# Patient Record
Sex: Male | Born: 1975 | Race: Black or African American | Hispanic: No | Marital: Married
Health system: Southern US, Community
[De-identification: ages and names within clinical notes are randomized; demographics above are authoritative.]

---

## 1998-06-28 ENCOUNTER — Emergency Department (HOSPITAL_COMMUNITY): Admission: EM | Admit: 1998-06-28 | Discharge: 1998-06-28 | Payer: Self-pay | Admitting: Emergency Medicine

## 1998-06-28 ENCOUNTER — Encounter: Payer: Self-pay | Admitting: Emergency Medicine

## 2005-06-09 ENCOUNTER — Emergency Department (HOSPITAL_COMMUNITY): Admission: EM | Admit: 2005-06-09 | Discharge: 2005-06-09 | Payer: Self-pay | Admitting: Emergency Medicine

## 2005-10-26 ENCOUNTER — Ambulatory Visit: Payer: Self-pay | Admitting: Internal Medicine

## 2005-10-26 ENCOUNTER — Inpatient Hospital Stay (HOSPITAL_COMMUNITY): Admission: EM | Admit: 2005-10-26 | Discharge: 2005-10-30 | Payer: Self-pay | Admitting: Emergency Medicine

## 2005-11-01 ENCOUNTER — Ambulatory Visit: Payer: Self-pay | Admitting: Internal Medicine

## 2005-11-05 ENCOUNTER — Ambulatory Visit: Payer: Self-pay | Admitting: Internal Medicine

## 2005-11-13 ENCOUNTER — Ambulatory Visit: Payer: Self-pay | Admitting: Internal Medicine

## 2005-11-19 ENCOUNTER — Ambulatory Visit: Payer: Self-pay | Admitting: Internal Medicine

## 2005-12-03 ENCOUNTER — Ambulatory Visit: Payer: Self-pay | Admitting: Internal Medicine

## 2005-12-17 ENCOUNTER — Ambulatory Visit: Payer: Self-pay | Admitting: Internal Medicine

## 2006-01-07 ENCOUNTER — Ambulatory Visit: Payer: Self-pay | Admitting: Hospitalist

## 2006-01-14 ENCOUNTER — Ambulatory Visit: Payer: Self-pay | Admitting: Hospitalist

## 2006-06-06 IMAGING — CR DG CHEST 2V
2 series · 2 of 2 positions shown · non-contrast
Comparison: none

CLINICAL DATA: Chest pain and shortness of breath.  
 CHEST - 2 VIEWS:

[view not recorded (1 of 2)]
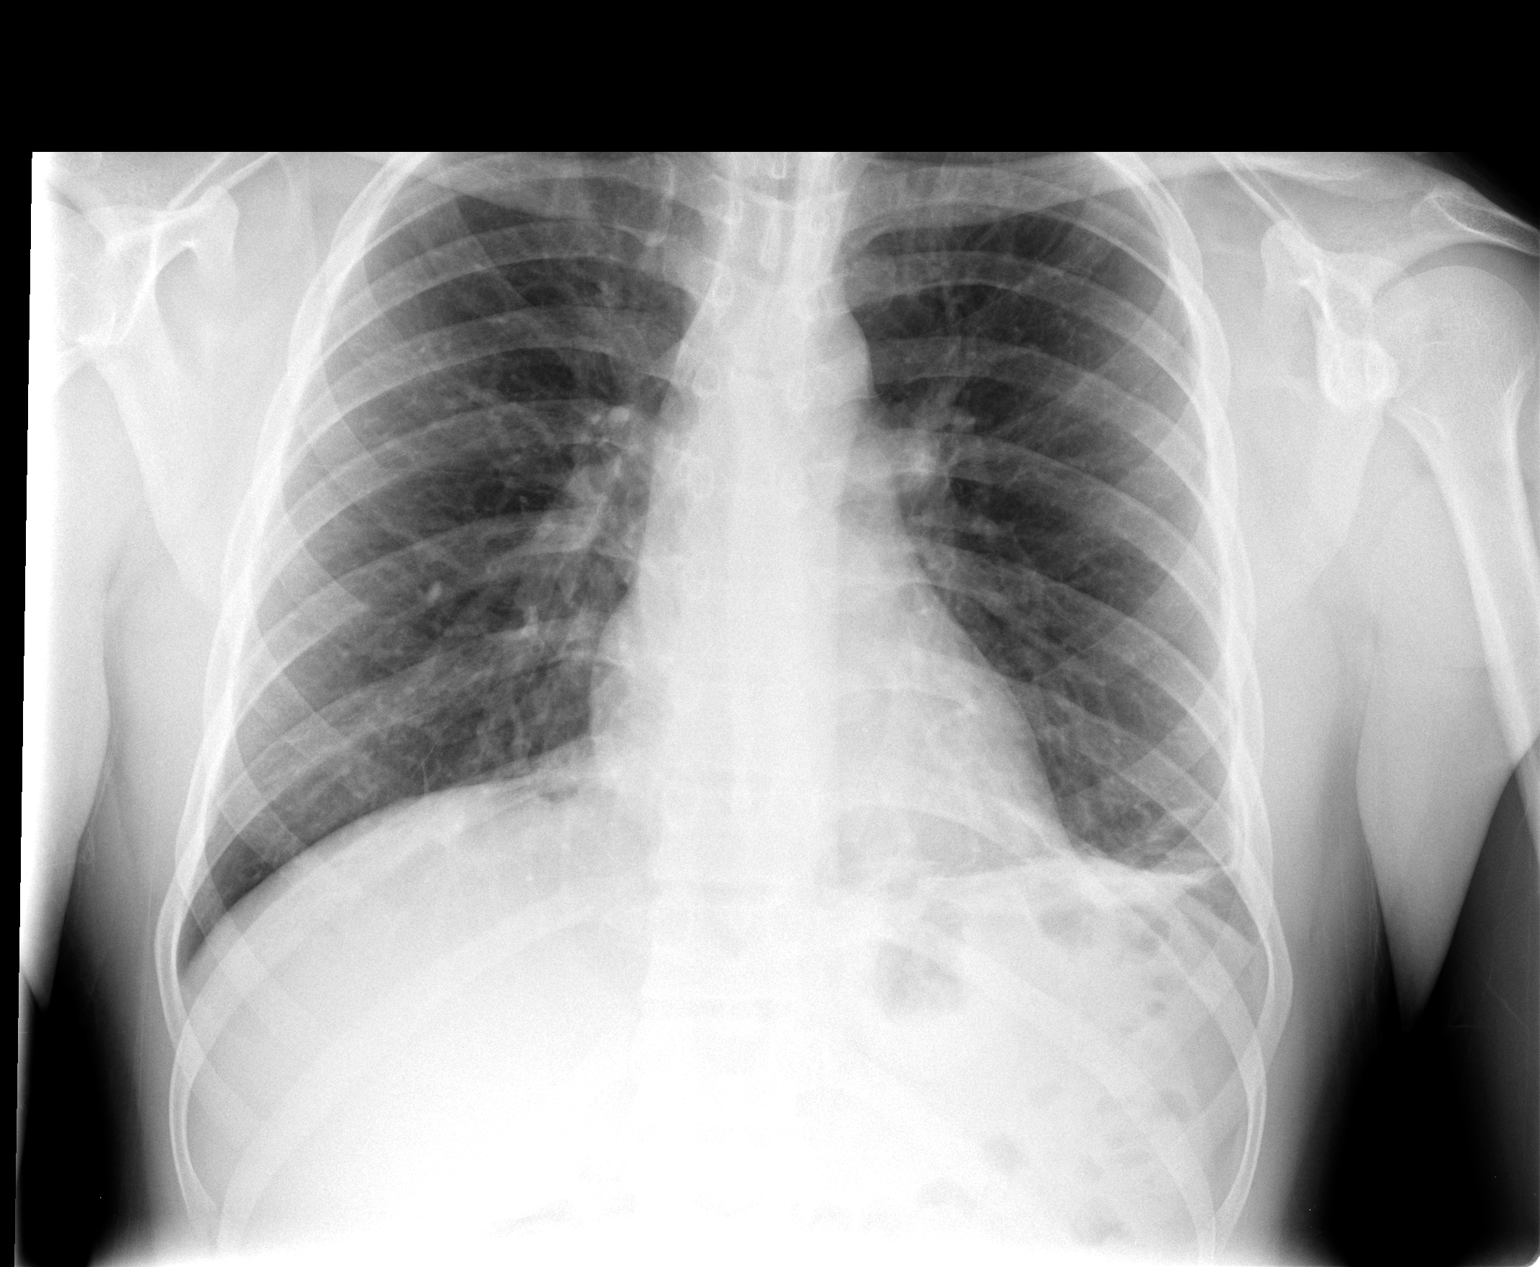

[view not recorded (2 of 2)]
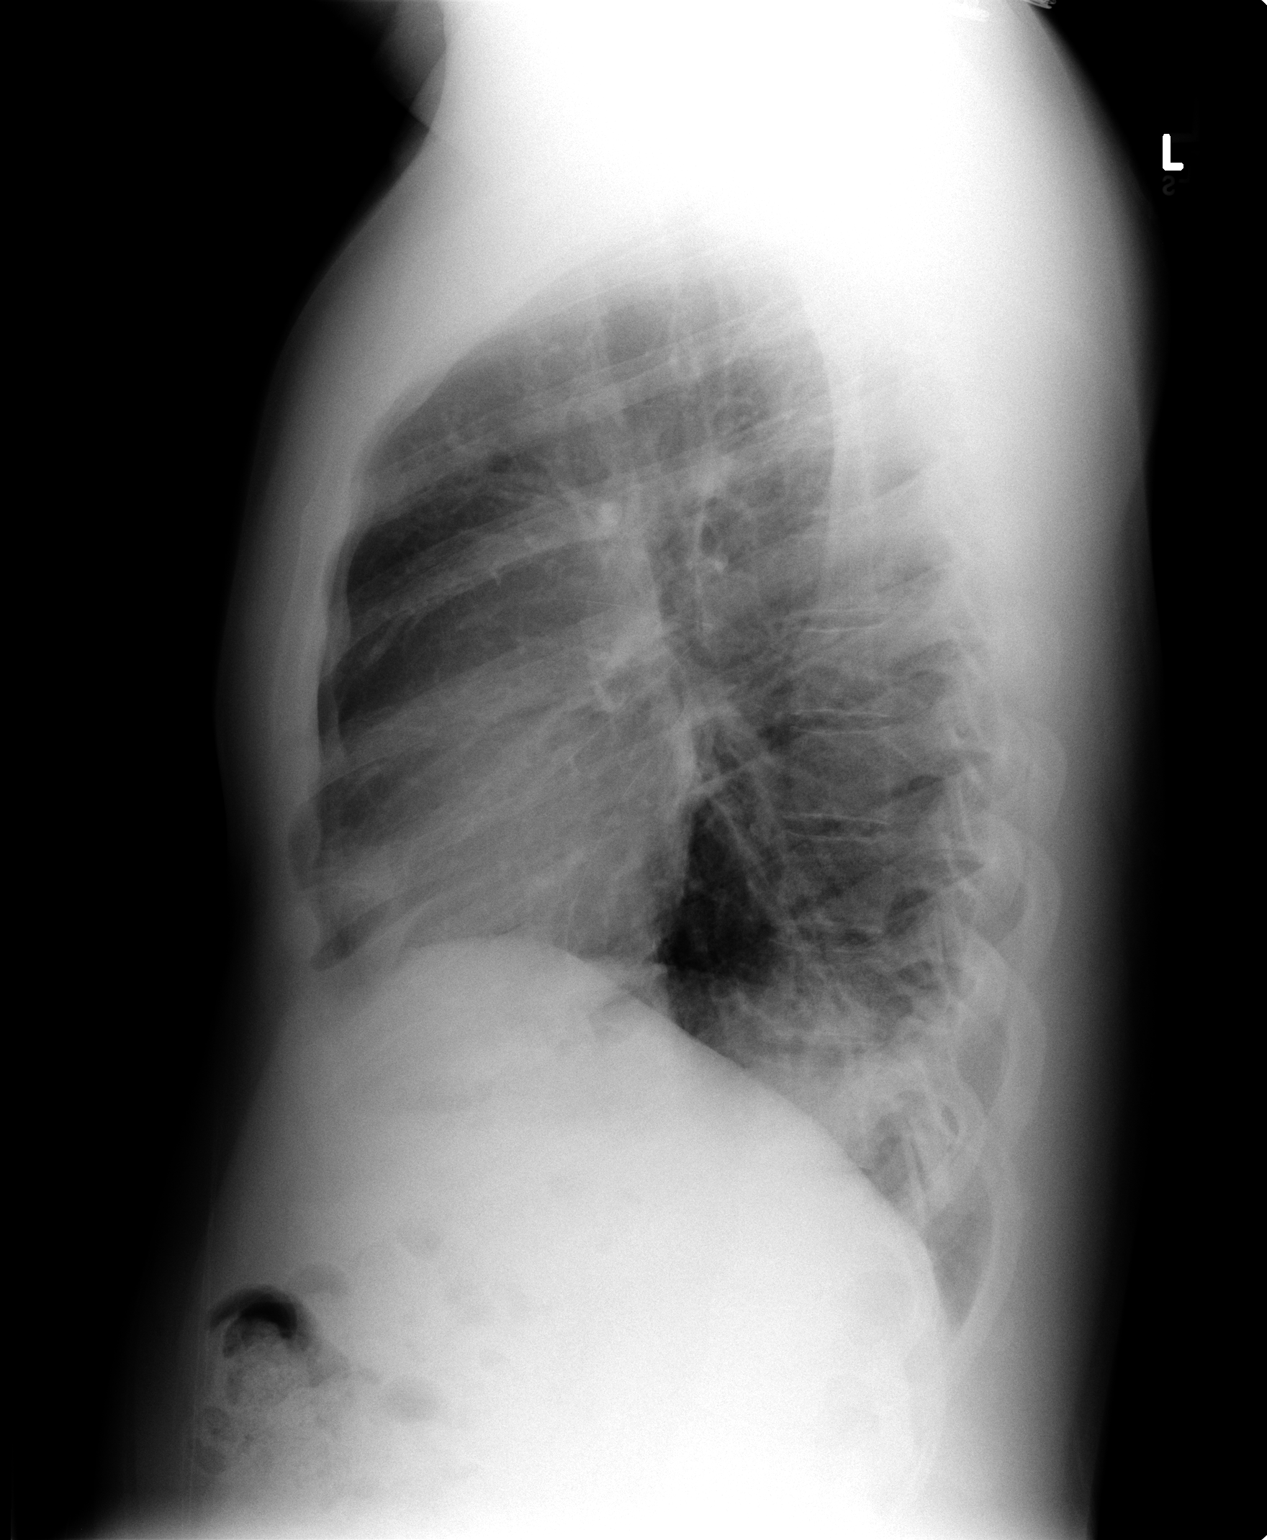

[2 of 2 positions shown; findings below may reference images not displayed]

FINDINGS: Two views of the chest show opacity at the left lung base.  On the frontal view this appears typical of atelectasis or scarring but on the lateral view could represent pneumonia and possibly a small effusion.  The right lung is clear.  The heart is within normal limits in size.
IMPRESSION: Posterior left lower lobe opacity.  Some of this opacity may be due to atelectasis but pneumonia and a small effusion are considerations.  Suggest follow-up chest x-ray.

## 2007-07-02 ENCOUNTER — Emergency Department (HOSPITAL_COMMUNITY): Admission: EM | Admit: 2007-07-02 | Discharge: 2007-07-02 | Payer: Self-pay | Admitting: Emergency Medicine

## 2007-07-02 ENCOUNTER — Encounter (INDEPENDENT_AMBULATORY_CARE_PROVIDER_SITE_OTHER): Payer: Self-pay | Admitting: Emergency Medicine

## 2007-07-02 ENCOUNTER — Ambulatory Visit: Payer: Self-pay | Admitting: Vascular Surgery

## 2009-05-19 ENCOUNTER — Emergency Department (HOSPITAL_COMMUNITY): Admission: EM | Admit: 2009-05-19 | Discharge: 2009-05-19 | Payer: Self-pay | Admitting: Family Medicine

## 2010-05-16 IMAGING — CR DG CHEST 2V
2 series · 2 of 2 positions shown · non-contrast
Comparison: 06/09/2005

CLINICAL DATA: Chest pain

CHEST - 2 VIEW

[view not recorded (1 of 2)]
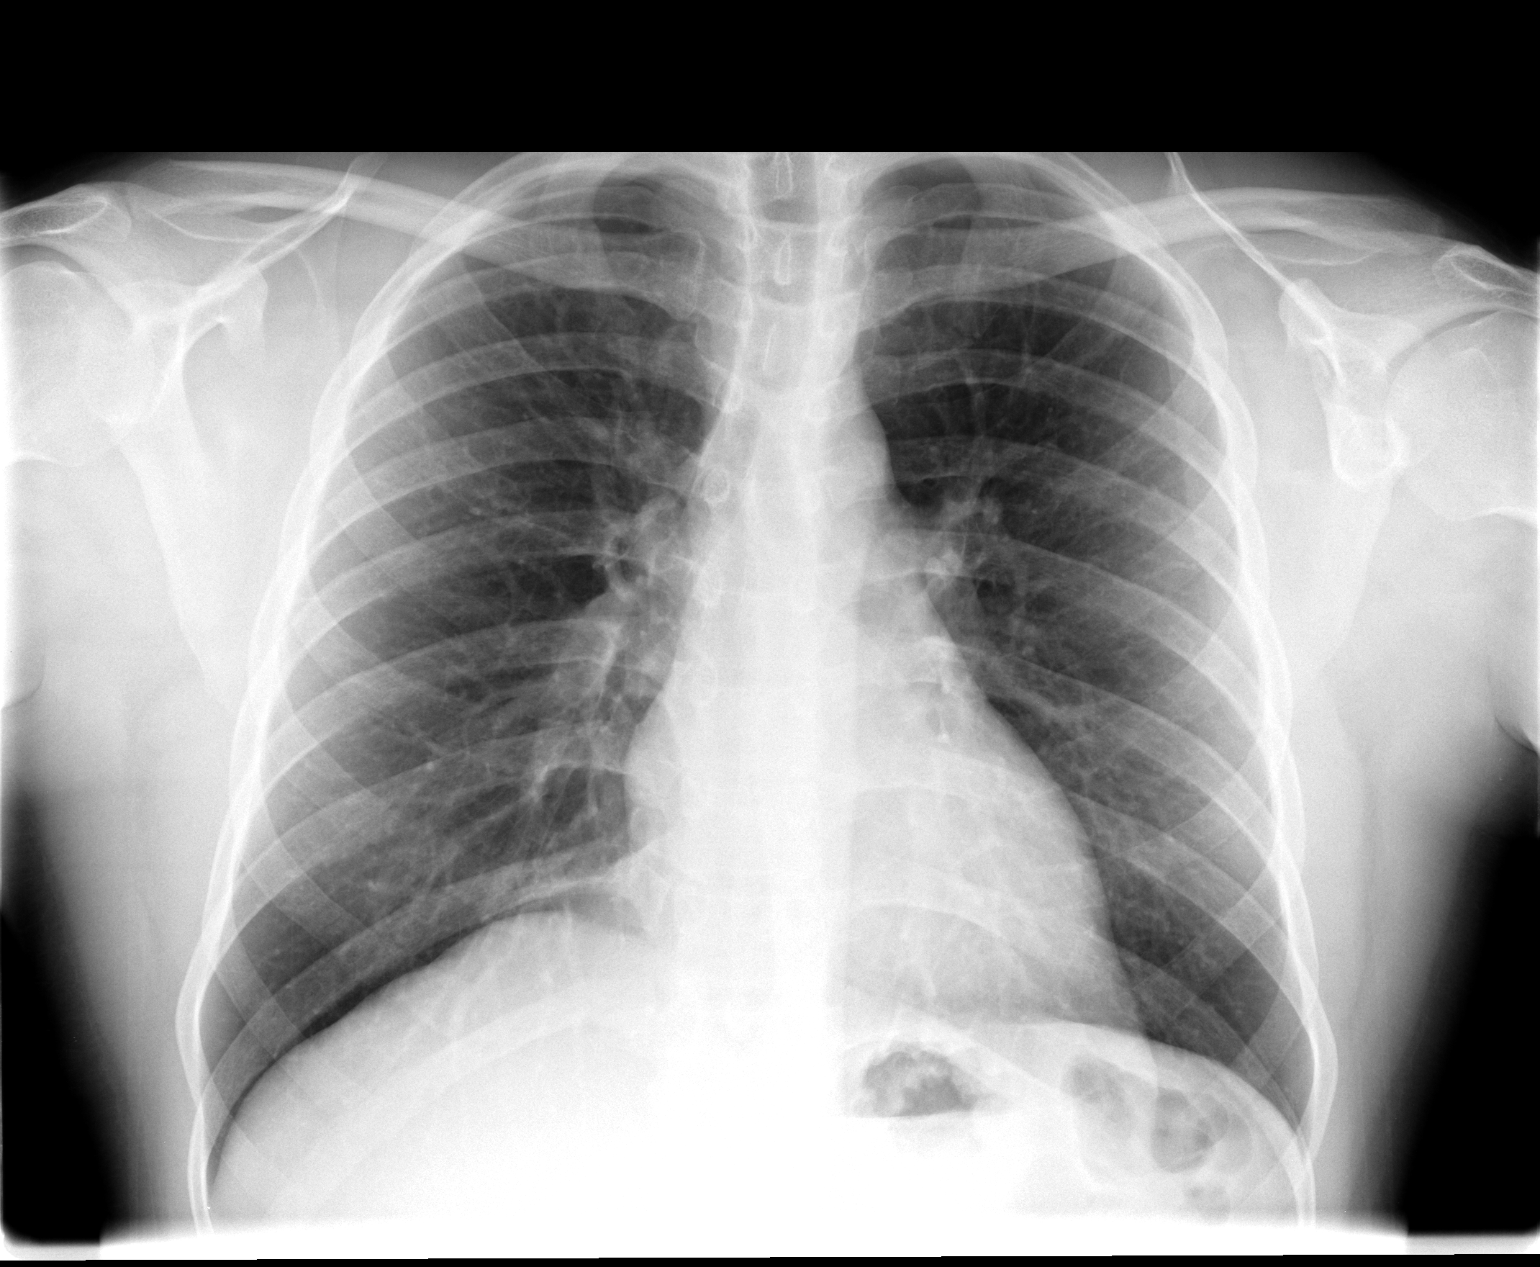

[view not recorded (2 of 2)]
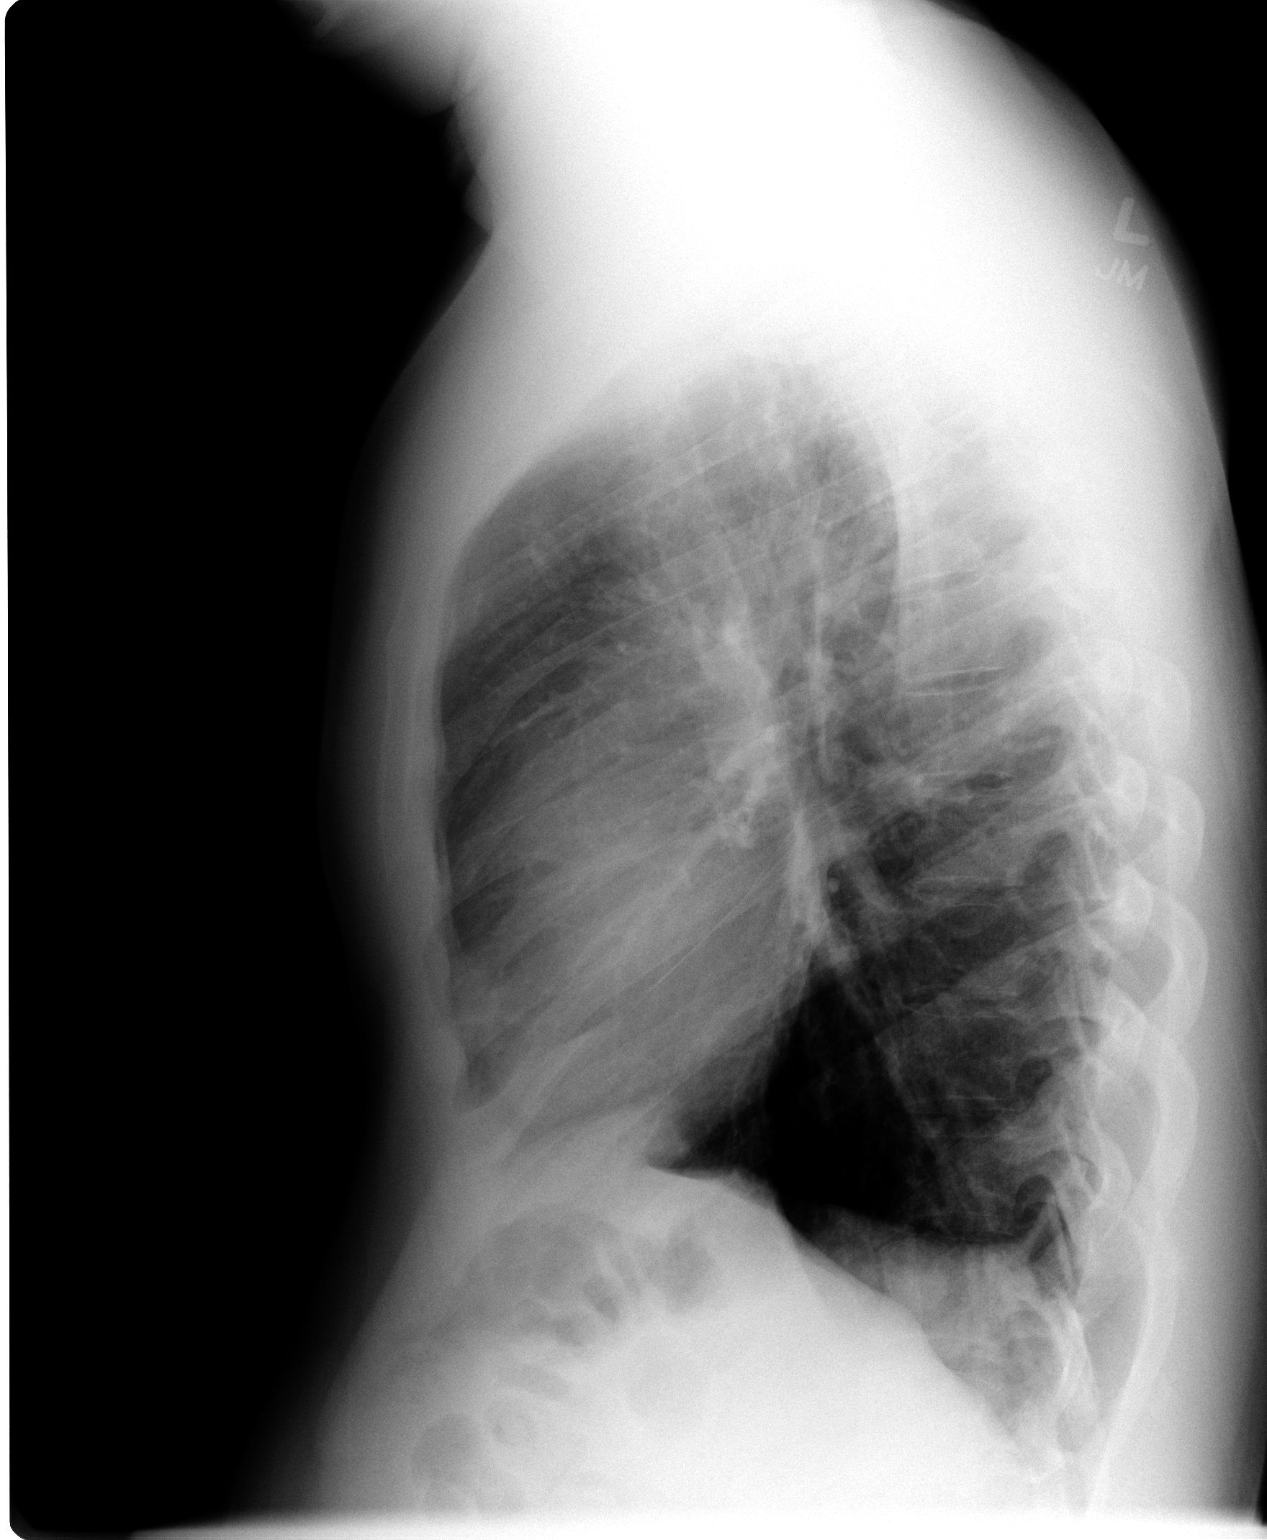

[2 of 2 positions shown; findings below may reference images not displayed]

FINDINGS: Hyperinflation. Lungs clear.  Heart size and pulmonary
vascularity normal.  No effusion.  Visualized bones unremarkable.
IMPRESSION: No acute disease

## 2010-11-03 NOTE — Discharge Summary (Signed)
Jaime Davenport, Jaime Davenport             ACCOUNT NO.:  [id number]   MEDICAL RECORD NO.:  [id number]          PATIENT TYPE:  INP   LOCATION:  5732                         FACILITY:  [facility]   PHYSICIAN:  Ileana Roup, M.D.  DATE OF BIRTH:  1976-03-27   DATE OF ADMISSION:  10/26/2005  DATE OF DISCHARGE:  10/30/2005                                 DISCHARGE SUMMARY   DISCHARGE DIAGNOSES:  1.  Acute pulmonary embolism to the right pulmonary artery.  2.  A left popliteal vein deep venous thrombosis.   DISCHARGE MEDICATIONS:  1.  Coumadin 7.5 mg 1 tablet q.h.s.  2.  Lovenox 90 mg SQ injection b.i.d. until told otherwise.  3.  Percocet 5/325 1 tablet q.6h p.r.n. pain #30 with no refills.   FOLLOW UP APPOINTMENTS:  1.  Dr. Michaell Cowing, clinical pharmacologist on Thursday, May 17, at 4 p.m. for      PT/INR evaluation to further modify Coumadin dosing.  2.  Dr. Dossie Arbour in [place], [place]. Patient is to contact Dr.      Dossie Arbour for a hospital follow up appointment.   DISCHARGE CONDITION:  Stable.   PROCEDURES:  Bilateral Doppler venous ultrasound completed Oct 29, 2005 with  positive DVT in the left popliteal vein; negative on the right lower  extremity.   ADMISSION HISTORY AND PHYSICAL:  Jaime Davenport is a 35 year old African-  American gentleman who presented to [facility] with a 2-day history of right-  sided chest pain worse with deep inspiration but no associated cough,  fevers, chills, significant leg swelling, leg pain recently.  The patient  had a similar episode in December of 2006 and was diagnosed with a pneumonia  and treated with azithromycin antibiotic with resolution of his symptoms.  The patient presented with his wife to his primary care physician's office,  Dr. Rito Ehrlich in [place], who sent the patient immediately over for a CT  angiogram of the chest which documented a pulmonary embolism to the right  pulmonary artery. As such he was advised to come to [facility] for  inpatient treatment as the family predominantly lives in the [place]  area.   PAST MEDICAL HISTORY:  1.  Questionable Baker cyst to the left leg approximately 1 year ago but no      imaging was performed at that time.  2.  Pneumonia with pleuritic chest pain in December of 2006.   MEDICATIONS:  A multivitamin daily.   SUBSTANCE HISTORY:  The patient was a former smoker but quit 3 years ago,  and social drinking on the weekend.   SOCIAL HISTORY:  The patient is married. He currently works as a Corporate investment banker. He has 2 children ages 27 and 32 years-old.   FAMILY HISTORY:  Significant for father with deep venous thrombosis and was  on Coumadin for a while.   ADMISSION PHYSICAL EXAM:  Temperature 97.9, blood pressure 132/77, pulse  103, respirations 20, O2 saturation 95% on room air.  In general the patient had mild distress, particularly with deep inspiration  in the emergency room. HEENT was essentially unremarkable.  Respiratory exam  showed some splinting but otherwise was clear to auscultation bilaterally  and there was no significant tenderness to palpation or crepitus.  Cardiovascular exam showed a regular rate and rhythm with no murmurs, rubs,  or gallops.  Extremities showed no edema, there was no palpable cord, there  was a negative Homan's sign.  Skin exam showed tinea versicolor with  hypopigmented plaques and sharp borders over the upper anterior and  posterior chest.   ADMISSION LABORATORY STUDIES:  Hemoglobin was 16.7, hematocrit 49. Sodium  136, potassium 4.2, chloride 106, bicarb 27, BUN 9, creatinine 1.4, glucose  132.  Arterial blood gas:  pH was 7.43, PCO2 46.2, PO2 71.0 with 95%  oxygenation on room air. D-dimer was elevated at 2.13.   HOSPITAL COURSE:  Right pulmonary artery embolus. The patient was admitted  to a telemetry floor and started on Coumadin and Lovenox injections with  monitoring of his PT/INR status. This was continued  during the duration of  his hospital stay.  The patient had no associated problems with his  hemoglobin and hematocrit. By the day of discharge the patient had only  managed to get up an INR of 1.4, however he was given a home dose regimen of  Lovenox and Coumadin with appropriate follow up on Nov 01, 2005 in the  outpatient clinic for follow up on his PT/INR.  He was specifically  instructed on the appropriate dosing of Lovenox injections and provided an  adequate number of Coumadin tablets as well as a sample of Coumadin 5 mg  tablets, #20 were given. He was advised to contact Dr. Michaell Cowing, clinical  pharmacologist should he have any signs or symptoms of bleeding upon which  he will be evaluated promptly.   LABORATORY TESTING:  To include homocysteine factor V Leiden. Protein C&S.  Lupus anticoagulant. Anticardiolipin and beta 2  cryoprotein serologies were all drawn but all of which were negative.   Left popliteal vein deep venous thrombosis - as part of the work up for his  pulmonary embolism, Doppler ultrasounds were obtained as a precaution and to  our surprise it was positive for DVT to the left popliteal vein.  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  He was thoroughly encouraged to  adequately exercise and perform routine stops should he return to that  employment position. Otherwise there was no significant problem noted with  his deep venous thrombosis.   PERTINENT LABORATORY STUDIES:  Discharge hemoglobin 14.8, hematocrit 44.5,  platelets 255,000. INR was 1.4. PT was 17.2. Lupus anticoagulant factor V  Leiden mutation, protein C, protein S, homocysteine, anticardiolipin and  beta 2 cryoprotein antibodies were all negative. Also prothrombin gene  mutation 20/20 was also unremarkable.      Coralie Carpen, M.D.    ______________________________  Ileana Roup, M.D.   FR/MEDQ  D:   11/03/2005  T:  11/03/2005  Job:  [id number]   cc:   Vonita Moss, M.D.  [place], [place]

## 2011-03-08 LAB — I-STAT 8, (EC8 V) (CONVERTED LAB)
Acid-Base Excess: 2
BUN: 8
Bicarbonate: 29.2 — ABNORMAL HIGH
Chloride: 105
Glucose, Bld: 76
HCT: 50
Hemoglobin: 17
Operator id: 294501
Potassium: 4
Sodium: 138
TCO2: 31
pCO2, Ven: 53.1 — ABNORMAL HIGH
pH, Ven: 7.348 — ABNORMAL HIGH

## 2011-03-08 LAB — DIFFERENTIAL
Eosinophils Absolute: 0.4
Eosinophils Relative: 4
Lymphs Abs: 0.8
Monocytes Absolute: 0.4
Monocytes Relative: 5

## 2011-03-08 LAB — CBC
HCT: 46.8
MCV: 90.4
Platelets: 245
WBC: 8.2

## 2011-03-08 LAB — PROTIME-INR: Prothrombin Time: 23.7 — ABNORMAL HIGH

## 2019-03-09 ENCOUNTER — Ambulatory Visit: Payer: Self-pay | Admitting: Podiatry

## 2019-04-18 ENCOUNTER — Emergency Department (HOSPITAL_BASED_OUTPATIENT_CLINIC_OR_DEPARTMENT_OTHER): Payer: 59

## 2019-04-18 ENCOUNTER — Other Ambulatory Visit: Payer: Self-pay

## 2019-04-18 ENCOUNTER — Emergency Department (HOSPITAL_COMMUNITY)
Admission: EM | Admit: 2019-04-18 | Discharge: 2019-04-18 | Disposition: A | Discharge status: Home / Self Care | Payer: 59 | Attending: Emergency Medicine | Admitting: Emergency Medicine

## 2019-04-18 DIAGNOSIS — I824Z1 Acute embolism and thrombosis of unspecified deep veins of right distal lower extremity: Secondary | ICD-10-CM | POA: Diagnosis not present

## 2019-04-18 DIAGNOSIS — M79604 Pain in right leg: Secondary | ICD-10-CM | POA: Diagnosis present

## 2019-04-18 DIAGNOSIS — I82401 Acute embolism and thrombosis of unspecified deep veins of right lower extremity: Secondary | ICD-10-CM

## 2019-04-18 DIAGNOSIS — M79609 Pain in unspecified limb: Secondary | ICD-10-CM | POA: Diagnosis not present

## 2019-04-18 MED ORDER — RIVAROXABAN (XARELTO) VTE STARTER PACK (15 & 20 MG): ORAL_TABLET | ORAL | 0 refills | Status: DC

## 2019-04-18 MED ORDER — RIVAROXABAN 15 MG PO TABS
15.0000 mg | ORAL_TABLET | Freq: Once | ORAL | Status: AC
  Administered 2019-04-18: 15 mg via ORAL
  Filled 2019-04-18: qty 1

## 2019-04-18 MED ORDER — RIVAROXABAN (XARELTO) EDUCATION KIT FOR DVT/PE PATIENTS
PACK | Freq: Once | Status: AC
  Administered 2019-04-18: 15:00:00
  Filled 2019-04-18: qty 1

## 2019-04-18 NOTE — ED Triage Notes (Signed)
Pt endorsees right calf pain x 5 days. Has hx of DVT in left lower leg in 2007 and this feels similar. Not on blood thinners. Had a plane ride to [place] 3 weeks ago. Denies shob. VSS.

## 2019-04-18 NOTE — ED Notes (Signed)
Pharc ist here to consult pt.

## 2019-04-18 NOTE — ED Notes (Signed)
Pharmacy to verify and will send.

## 2019-04-18 NOTE — ED Provider Notes (Signed)
[facility] EMERGENCY DEPARTMENT Provider Note   CSN: [id number] Arrival date & time: 04/18/19  1129     History   Chief Complaint Chief Complaint  Patient presents with   Leg Pain    HPI Jaime Davenport is a 43 y.o. male presents today for evaluation of acute onset, progressively worsening right calf pain for 5 days.  He notes pain is dull and throbbing, very mild with his leg extended but worsens with flexion and ambulation/weightbearing.  Denies any known injury to the lower extremity.  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  He was anticoagulated for 4 years but not since then.  Denies any swelling to the right lower extremity.  Has been taking ibuprofen and Tylenol with little improvement.  Denies shortness of breath or chest pain.  He recently traveled to [place] 3 weeks ago, return flight was nonstop and 5 hours duration.  He is not on hormone replacement therapy.     The history is provided by the patient.    No past medical history on file.  There are no active problems to display for this patient.     Home Medications    Prior to Admission medications   Medication Sig Start Date End Date Taking? Authorizing Provider  Phenyleph-CPM-DM-APAP (ALKA-SELTZER PLUS COLD & COUGH) 10-17-08-325 MG CAPS Take 2 capsules by mouth 2 (two) times daily as needed (cold symptoms).   Yes [provider]  Rivaroxaban 15 & 20 MG TBPK Follow package directions: Take one '15mg'$  tablet by mouth twice a day. On day 22, switch to one '20mg'$  tablet once a day. Take with food. 04/18/19   Renita Papa, PA-C    Family History No family history on file.  Social History Social History   Tobacco Use   Smoking status: Not on file  Substance Use Topics   Alcohol use: Not on file   Drug use: Not on file     Allergies   Codeine   Review of  Systems Review of Systems  Constitutional: Negative for chills and fever.  Respiratory: Negative for shortness of breath.   Cardiovascular: Negative for chest pain and leg swelling.  Musculoskeletal: Positive for myalgias.  Neurological: Negative for weakness and numbness.  All other systems reviewed and are negative.    Physical Exam Updated Vital Signs BP 116/88 (BP Location: Right Arm)    Pulse 60    Temp 98.1 F (36.7 C) (Oral)    Resp 16    SpO2 100%   Physical Exam Vitals signs and nursing note reviewed.  Constitutional:      General: He is not in acute distress.    Appearance: He is well-developed.     Comments: Resting comfortably in chair  HENT:     Head: Normocephalic and atraumatic.  Eyes:     General:        Right eye: No discharge.        Left eye: No discharge.     Conjunctiva/sclera: Conjunctivae normal.  Neck:     Musculoskeletal: Neck supple.     Vascular: No JVD.     Trachea: No tracheal deviation.  Cardiovascular:     Rate and Rhythm: Normal rate and regular rhythm.     Pulses: Normal pulses.     Heart sounds: Normal heart sounds.     Comments: 2+  DP/PT pulses bilaterally.  Compartments are soft.  Bevelyn Buckles' sign positive on the right Pulmonary:     Effort: Pulmonary effort is normal. No respiratory distress.     Breath sounds: Normal breath sounds. No wheezing.     Comments: Speaking in full sentences without difficulty, SPO2 saturation 99% on room air Abdominal:     General: There is no distension.  Musculoskeletal:     Comments: Tenderness to palpation of the right calf though calves measure equally circumferentially bilaterally.  5/5 strength of BLE major muscle groups.  No swelling, crepitus, deformity or tenderness to palpation of the right knee or ankle.  Skin:    General: Skin is warm and dry.     Findings: No erythema.  Neurological:     Mental Status: He is alert.     Comments: Ambulatory with steady gait and balance.  Sensation intact to  light touch of bilateral lower extremities.  Psychiatric:        Behavior: Behavior normal.      ED Treatments / Results  Labs (all labs ordered are listed, but only abnormal results are displayed) Labs Reviewed - No data to display  EKG None  Radiology Vas [place] Lower Extremity Venous (dvt) (only [facility] 7a-7p)  Result Date: 04/18/2019  Lower Venous Study Indications: Pain.  Limitations: Patent's clothing in way. Comparison Study: No prior study on file for comparison Performing Technologist: Sharion Dove RVS  Examination Guidelines: A complete evaluation includes B-mode imaging, spectral Doppler, color Doppler, and power Doppler as needed of all accessible portions of each vessel. Bilateral testing is considered an integral part of a complete examination. Limited examinations for reoccurring indications may be performed as noted.  +---------+---------------+---------+-----------+----------+--------------+  RIGHT     Compressibility Phasicity Spontaneity Properties Thrombus Aging  +---------+---------------+---------+-----------+----------+--------------+  CFV                                                        Not visualized  +---------+---------------+---------+-----------+----------+--------------+  SFJ                                                        Not visualized  +---------+---------------+---------+-----------+----------+--------------+  FV Prox   Full                                                             +---------+---------------+---------+-----------+----------+--------------+  FV Mid    Full                                                             +---------+---------------+---------+-----------+----------+--------------+  FV Distal Full                                                             +---------+---------------+---------+-----------+----------+--------------+  PFV       Full                                                              +---------+---------------+---------+-----------+----------+--------------+  POP       Full                                                             +---------+---------------+---------+-----------+----------+--------------+  PTV       None                                             Acute           +---------+---------------+---------+-----------+----------+--------------+  PERO      None                                             Acute           +---------+---------------+---------+-----------+----------+--------------+   Left Technical Findings: Left leg not evaluated.   Summary: Right: Findings consistent with acute deep vein thrombosis involving the right posterior tibial veins, and right peroneal veins.  *See table(s) above for measurements and observations.    Preliminary     Procedures Procedures (including critical care time)  Medications Ordered in ED Medications  rivaroxaban Alveda Reasons) Education Kit for DVT/PE patients ( Does not apply Given 04/18/19 1503)  Rivaroxaban (XARELTO) tablet 15 mg (15 mg Oral Given 04/18/19 1504)     Initial Impression / Assessment and Plan / ED Course  I have reviewed the triage vital signs and the nursing notes.  Pertinent labs & imaging results that were available during my care of the patient were reviewed by me and considered in my medical decision making (see chart for details).        Patient presenting for evaluation of right calf pain.  No trauma to the right lower extremity.  Has a history of DVT and PE in 2007 for which he was anticoagulated initially on heparin and then on Coumadin for 4 years but has not been on any anticoagulation since then.  Had recent travel to [place] 3 weeks ago.  Denies chest pain or shortness of breath.  He is afebrile, vital signs are stable.  He is neurovascularly intact.  Ambulatory without difficulty despite pain.  No tachycardia, tachypnea, or hypoxia.  Low suspicion of PE.  DVT study was performed which  shows acute DVT involving the right posterior tibial veins and the right peroneal veins.  No concern for septic arthritis.  Compartments are soft.  We will start him on Xarelto, pharmacy to come speak with the patient and provide additional education.  Recommend follow-up with PCP for reevaluation.  Discussed strict ED return precautions.  Patient and his wife who was available virtually (discussed results with patient's permission) verbalized understanding of and agreement with plan and patient stable for  discharge home at this time.  Final Clinical Impressions(s) / ED Diagnoses   Final diagnoses:  Acute deep vein thrombosis (DVT) of right lower extremity, unspecified vein [facility])    ED Discharge Orders         Ordered    Rivaroxaban 15 & 20 MG TBPK     04/18/19 1438           Renita Papa, PA-C 04/18/19 1628    Noemi Chapel, MD 04/21/19 1544

## 2019-04-18 NOTE — Discharge Instructions (Addendum)
Start taking Xarelto as prescribed.  You will take 50 mg twice daily for 21 days and then on day 22 switch to 20 mg tablets once daily.  Do not take ibuprofen, Aleve, Advil, or Motrin while taking Xarelto.  You can take 1 to 2 tablets of Tylenol every 6 hours as needed for pain.  Keep the extremity elevated, can also wear compression stockings or apply Ace wrap for compression.  Follow-up with your primary care provider for reevaluation of your symptoms.  You can call the number on the back of your insurance card or go online to see primary care providers taking new patients in your area.  I have attached information for precautions while taking blood thinners.  Return to the emergency department immediately if any concerning signs or symptoms develop such as fevers, shortness of breath, chest pain, lightheadedness or loss of consciousness.   Information on my medicine - XARELTO (rivaroxaban) WHY WAS XARELTO PRESCRIBED FOR YOU? Xarelto was prescribed to treat blood clots that may have been found in the veins of your legs (deep vein thrombosis) or in your lungs (pulmonary embolism) and to reduce the risk of them occurring again.  What do you need to know about Xarelto? The starting dose is one 15 mg tablet taken TWICE daily with food for the FIRST 21 DAYS then on  05/08/19  the dose is changed to one 20 mg tablet taken ONCE A DAY with your evening meal.  DO NOT stop taking Xarelto without talking to the health care provider who prescribed the medication.  Refill your prescription for 20 mg tablets before you run out.  After discharge, you should have regular check-up appointments with your healthcare provider that is prescribing your Xarelto.  In the future your dose may need to be changed if your kidney function changes by a significant amount.  What do you do if you miss a dose? If you are taking Xarelto TWICE DAILY and you miss a dose, take it as soon as you remember. You may take two  15 mg tablets (total 30 mg) at the same time then resume your regularly scheduled 15 mg twice daily the next day.  If you are taking Xarelto ONCE DAILY and you miss a dose, take it as soon as you remember on the same day then continue your regularly scheduled once daily regimen the next day. Do not take two doses of Xarelto at the same time.   Important Safety Information Xarelto is a blood thinner medicine that can cause bleeding. You should call your healthcare provider right away if you experience any of the following: ? Bleeding from an injury or your nose that does not stop. ? Unusual colored urine (red or dark brown) or unusual colored stools (red or black). ? Unusual bruising for unknown reasons. ? A serious fall or if you hit your head (even if there is no bleeding).  Some medicines may interact with Xarelto and might increase your risk of bleeding while on Xarelto. To help avoid this, consult your healthcare provider or pharmacist prior to using any new prescription or non-prescription medications, including herbals, vitamins, non-steroidal anti-inflammatory drugs (NSAIDs) and supplements.  This website has more information on Xarelto: [email or web address]-[email or web address].[email or web address]

## 2019-04-18 NOTE — Progress Notes (Signed)
VASCULAR LAB PRELIMINARY  PRELIMINARY  PRELIMINARY  PRELIMINARY  Right lower extremity venous duplex completed.    Preliminary report:  See CV proc for preliminary results.  Gave Rodell Perna, PA-C report.   Deniesha Stenglein, RVT 04/18/2019, 3:29 PM

## 2024-03-13 ENCOUNTER — Ambulatory Visit: Admitting: Endocrinology

## 2024-03-13 ENCOUNTER — Encounter: Payer: Self-pay | Admitting: Endocrinology

## 2024-03-13 DIAGNOSIS — E559 Vitamin D deficiency, unspecified: Secondary | ICD-10-CM

## 2024-03-13 NOTE — Progress Notes (Signed)
 Outpatient Endocrinology Note [name], MD   Patient's Name: Jaime Davenport    DOB: 1975/11/30    MRN: [id number]  REASON OF VISIT: New consult for hypercalcemia  REFERRING PROVIDER: Delana Corean GRADE, MD    PCP: Patient, No Pcp Per  HISTORY OF PRESENT ILLNESS:   Jaime Davenport is a 48 y.o. old male with past medical history listed below, is here for new consult of hypercalcemia.  Pertinent Calcium History: Patient is referred to endocrinology for evaluation and management of hypercalcemia.  Medical records see scanned in media and some of the record from his patient portal in the phone reviewed.  Patient noted to have elevated serum calcium of 11.3 in December, of 2024 with eGFR 76, creatinine 1.18.  At that time he had PSA of normal 0.41.  Repeat lab in February 2025 showed serum ionized calcium of 6.0 mild elevation with upper normal limit of 5.5, PTH meter normal 40.1 with reference range of 18.4-80.1.  Repeat lab in Nov 01, 2023 with serum calcium of 10.7, reference range 8.3-10.6.  Elevated creatinine of 1.48 and eGFR of 58.  He had 24-hour urine calcium checked in May 2025 was low 33 with reference range of 55-300.  Urine volume was 1300 mL.  Review of the care everywhere in August 02, 2021 serum calcium 10.5, albumin of 4.3 with upper normal limit of serum calcium 10.5.  He had hemoglobin A1c of 5.9% in prediabetes range in June 2024.  He had normal TSH in June 2024.  Patient has occasional stomach upset otherwise no complaints today.  No history of kidney stone.  He has not been on medication that would increase serum calcium including hydrochlorothiazide or lithium.  No history of fragility fracture.  He reports his son has hypercalcemia problem however does not know detail.  No other family members with known kidney stone or calcium problem.   He has vitamin D  deficiency with vitamin D  level of 11 in December 2024.  He had vitamin D  level of 18 in June 2024.   He has been occasionally taking vitamin D3 10,000 international unit daily along with vitamin K.  REVIEW OF SYSTEMS:  As per history of present illness.   PAST MEDICAL HISTORY: History reviewed. No pertinent past medical history.  PAST SURGICAL HISTORY: History reviewed. No pertinent surgical history.  ALLERGIES: Allergies  Allergen Reactions   Codeine Rash and Itching    FAMILY HISTORY:  History reviewed. No pertinent family history. No known family history of hypercalcemia, kidney stone, hyperparathyroidism, or endocrine neoplasms.   SOCIAL HISTORY: Social History   Socioeconomic History   Marital status: Married    Spouse name: Not on file   Number of children: Not on file   Years of education: Not on file   Highest education level: Not on file  Occupational History   Not on file  Tobacco Use   Smoking status: Unknown   Smokeless tobacco: Not on file  Substance and Sexual Activity   Alcohol use: Not on file   Drug use: Not on file   Sexual activity: Not on file  Other Topics Concern   Not on file  Social History Narrative   Not on file   Social Drivers of Health   Financial Resource Strain: Unknown (08/02/2020)   Received from [facility] visits prior to 08/18/2022.   Overall Financial Resource Strain (CARDIA)    Difficulty of Paying Living Expenses: Patient refused  Food Insecurity: Unknown (08/02/2020)  Received from [facility] visits prior to 08/18/2022.   Hunger Vital Sign    Within the past 12 months, you worried that your food would run out before you got the money to buy more.: Patient refused    Within the past 12 months, the food you bought just didn't last and you didn't have money to get more.: Patient refused  Transportation Needs: No Transportation Needs (08/02/2020)   Received from [facility] visits prior to 08/18/2022.   PRAPARE - Administrator, Civil Service (Medical):  No    Lack of Transportation (Non-Medical): No  Physical Activity: Insufficiently Active (08/02/2020)   Received [facility] visits prior to 08/18/2022.   Exercise Vital Sign    On average, how many days per week do you engage in moderate to strenuous exercise (like a brisk walk)?: 4 days    On average, how many minutes do you engage in exercise at this level?: 10 min  Stress: No Stress Concern Present (08/02/2020)   Received [facility] visits prior to 08/18/2022.   [employer] of Occupational Health - Occupational Stress Questionnaire    Feeling of Stress : Not at all  Social Connections: Unknown (08/02/2020)   [facility] visits prior to 08/18/2022.   Social Connection and Isolation Panel    In a typical week, how many times do you talk on the phone with family, friends, or neighbors?: More than three times a week    How often do you get together with friends or relatives?: Patient refused    How often do you attend church or religious services?: Patient refused    Do you belong to any clubs or organizations such as church groups, unions, fraternal or athletic groups, or school groups?: No    How often do you attend meetings of the clubs or organizations you belong to?: Patient refused    Are you married, widowed, divorced, separated, never married, or living with a partner?: Married    MEDICATIONS:  Current Outpatient Medications  Medication Sig Dispense Refill   Phenyleph-CPM-DM-APAP (ALKA-SELTZER PLUS COLD & COUGH) 10-17-08-325 MG CAPS Take 2 capsules by mouth 2 (two) times daily as needed (cold symptoms).     rivaroxaban  (XARELTO ) 20 MG TABS tablet Take 20 mg by mouth daily with supper.     No current facility-administered medications for this visit.    PHYSICAL EXAM: Vitals:   03/13/24 0835  BP: 122/88  Pulse: 82  Resp: 20  SpO2: 98%  Weight: 199 lb 6.4 oz (90.4 kg)   There is no height  or weight on file to calculate BMI.   General: Well developed, well nourished male in no apparent distress.  HEENT: AT/[place], no external lesions. Hearing intact to the spoken word Eyes: EOMI. Conjunctiva clear and no icterus. Neck: Trachea midline, neck supple without appreciable thyromegaly or lymphadenopathy and no palpable thyroid nodules Lungs: Clear to auscultation, no wheeze. Respirations not labored Heart: S1S2, Regular in rate and rhythm. No loud murmurs Abdomen: Soft, non tender, non distended, no masses, no striae Neurologic: Alert, oriented, normal speech, deep tendon biceps reflexes normal,  no gross focal neurological deficit Extremities: No pedal pitting edema, no tremors of outstretched hands Skin: Warm, color good.  Psychiatric: Does not appear depressed or anxious  PERTINENT HISTORIC LABORATORY AND IMAGING STUDIES:  All pertinent laboratory results were reviewed. Please see HPI also for further  details.   ASSESSMENT / PLAN  1. Hypercalcemia   2. Vitamin D  deficiency    Patient was noted to have hypercalcemia in December 2025, serum calcium was 11.3 however repeat serum calcium was improved to 10.7 in May 2025.  He had hide normal serum calcium of 10.5 in 2023 as well.  He had normal PTH mid normal range of 40 and had low 25 urine calcium of 33 when evaluated at [place].  No history of kidney stone.  He has vitamin D  deficiency occasionally taking vitamin D3 supplement 10,000 international unit.  Vitamin D  level was 11 in December 2024.  Etiology of hypercalcemia is unclear at this time.  He had low 24-hour urine calcium.  It is possible that he may has family will ? hypocalciuric hypercalcemia.  Other cause of hypercalcemia can be primary hyperparathyroidism however he had normal PTH.  He needs additional laboratory workup.  Will repeat lab as follows today.  Plan: - Check renal function panel, PTH, magnesium and vitamin D  level today. - Rest of the plan based on test  results from today.  Diagnoses and all orders for this visit:  Hypercalcemia -     VITAMIN D  25 Hydroxy (Vit-D Deficiency, Fractures) -     Parathyroid hormone, intact (no Ca) -     Renal function panel -     Magnesium  Vitamin D  deficiency -     VITAMIN D  25 Hydroxy (Vit-D Deficiency, Fractures)    DISPOSITION Follow up in clinic in to be determined based on test results from today.  All questions answered and patient verbalized understanding of the plan.  [place] Chava Dulac, MD [facility] Endocrinology [facility] [address] [place], [address] [place], [place] [address] Phone # [phone]  At least part of this note was generated using voice recognition software. Inadvertent word errors may have occurred, which were not recognized during the proofreading process.

## 2024-03-14 LAB — RENAL FUNCTION PANEL
Albumin: 4.2 g/dL (ref 3.6–5.1)
BUN/Creatinine Ratio: 11 (calc) (ref 6–22)
BUN: 16 mg/dL (ref 7–25)
CO2: 29 mmol/L (ref 20–32)
Calcium: 10.6 mg/dL — ABNORMAL HIGH (ref 8.6–10.3)
Chloride: 103 mmol/L (ref 98–110)
Creat: 1.5 mg/dL — ABNORMAL HIGH (ref 0.70–1.30)
Glucose, Bld: 88 mg/dL (ref 65–99)
Phosphorus: 3 mg/dL (ref 2.5–4.5)
Potassium: 4.6 mmol/L (ref 3.5–5.3)
Sodium: 138 mmol/L (ref 135–146)

## 2024-03-14 LAB — VITAMIN D 25 HYDROXY (VIT D DEFICIENCY, FRACTURES): Vit D, 25-Hydroxy: 58 ng/mL (ref 30–100)

## 2024-03-14 LAB — MAGNESIUM: Magnesium: 2.3 mg/dL (ref 1.5–2.5)

## 2024-03-14 LAB — PARATHYROID HORMONE, INTACT (NO CA): PTH: 39 pg/mL (ref 16–77)

## 2024-03-17 ENCOUNTER — Ambulatory Visit: Payer: Self-pay | Admitting: Endocrinology

## 2024-03-17 DIAGNOSIS — E673 Hypervitaminosis D: Secondary | ICD-10-CM

## 2024-03-17 DIAGNOSIS — E559 Vitamin D deficiency, unspecified: Secondary | ICD-10-CM

## 2024-08-21 ENCOUNTER — Ambulatory Visit: Admitting: Rheumatology

## 2024-09-17 ENCOUNTER — Other Ambulatory Visit

## 2024-09-25 ENCOUNTER — Ambulatory Visit: Admitting: Endocrinology

## 2024-10-02 ENCOUNTER — Ambulatory Visit: Admitting: Rheumatology
